# Patient Record
Sex: Male | Born: 1965 | Race: Black or African American | Hispanic: No | Marital: Single | Smoking: Never smoker
Health system: Southern US, Community
[De-identification: ages and names within clinical notes are randomized; demographics above are authoritative.]

## PROBLEM LIST (undated history)

## (undated) DIAGNOSIS — L940 Localized scleroderma [morphea]: Secondary | ICD-10-CM

## (undated) DIAGNOSIS — F32A Depression, unspecified: Secondary | ICD-10-CM

## (undated) DIAGNOSIS — F419 Anxiety disorder, unspecified: Secondary | ICD-10-CM

## (undated) DIAGNOSIS — I10 Essential (primary) hypertension: Secondary | ICD-10-CM

## (undated) DIAGNOSIS — M549 Dorsalgia, unspecified: Secondary | ICD-10-CM

## (undated) DIAGNOSIS — F329 Major depressive disorder, single episode, unspecified: Secondary | ICD-10-CM

## (undated) DIAGNOSIS — M1990 Unspecified osteoarthritis, unspecified site: Secondary | ICD-10-CM

## (undated) DIAGNOSIS — G8929 Other chronic pain: Secondary | ICD-10-CM

## (undated) HISTORY — PX: ROTATOR CUFF REPAIR: SHX139

## (undated) HISTORY — DX: Unspecified osteoarthritis, unspecified site: M19.90

## (undated) HISTORY — DX: Major depressive disorder, single episode, unspecified: F32.9

## (undated) HISTORY — DX: Anxiety disorder, unspecified: F41.9

## (undated) HISTORY — DX: Depression, unspecified: F32.A

---

## 2010-07-23 ENCOUNTER — Emergency Department (HOSPITAL_COMMUNITY): Admission: EM | Admit: 2010-07-23 | Discharge: 2010-07-23 | Payer: Self-pay | Admitting: Emergency Medicine

## 2010-12-24 LAB — URINE CULTURE
Colony Count: NO GROWTH
Culture  Setup Time: 201110131052
Culture: NO GROWTH

## 2010-12-24 LAB — URINALYSIS, ROUTINE W REFLEX MICROSCOPIC
Glucose, UA: NEGATIVE mg/dL
Hgb urine dipstick: NEGATIVE
Protein, ur: NEGATIVE mg/dL
pH: 5.5 (ref 5.0–8.0)

## 2010-12-24 LAB — GC/CHLAMYDIA PROBE AMP, GENITAL: Chlamydia, DNA Probe: NEGATIVE

## 2011-12-06 ENCOUNTER — Emergency Department (HOSPITAL_COMMUNITY)
Admission: EM | Admit: 2011-12-06 | Discharge: 2011-12-06 | Disposition: A | Discharge status: Home / Self Care | Payer: No Typology Code available for payment source | Attending: Emergency Medicine | Admitting: Emergency Medicine

## 2011-12-06 ENCOUNTER — Emergency Department (HOSPITAL_COMMUNITY): Payer: No Typology Code available for payment source

## 2011-12-06 ENCOUNTER — Encounter (HOSPITAL_COMMUNITY): Payer: Self-pay | Admitting: *Deleted

## 2011-12-06 DIAGNOSIS — S139XXA Sprain of joints and ligaments of unspecified parts of neck, initial encounter: Secondary | ICD-10-CM | POA: Insufficient documentation

## 2011-12-06 DIAGNOSIS — G8929 Other chronic pain: Secondary | ICD-10-CM | POA: Insufficient documentation

## 2011-12-06 DIAGNOSIS — S161XXA Strain of muscle, fascia and tendon at neck level, initial encounter: Secondary | ICD-10-CM

## 2011-12-06 DIAGNOSIS — I10 Essential (primary) hypertension: Secondary | ICD-10-CM

## 2011-12-06 DIAGNOSIS — M542 Cervicalgia: Secondary | ICD-10-CM | POA: Insufficient documentation

## 2011-12-06 DIAGNOSIS — R030 Elevated blood-pressure reading, without diagnosis of hypertension: Secondary | ICD-10-CM | POA: Insufficient documentation

## 2011-12-06 DIAGNOSIS — M25519 Pain in unspecified shoulder: Secondary | ICD-10-CM | POA: Insufficient documentation

## 2011-12-06 DIAGNOSIS — M549 Dorsalgia, unspecified: Secondary | ICD-10-CM | POA: Insufficient documentation

## 2011-12-06 DIAGNOSIS — T148XXA Other injury of unspecified body region, initial encounter: Secondary | ICD-10-CM | POA: Insufficient documentation

## 2011-12-06 HISTORY — DX: Dorsalgia, unspecified: M54.9

## 2011-12-06 HISTORY — DX: Other chronic pain: G89.29

## 2011-12-06 MED ORDER — ORPHENADRINE CITRATE ER 100 MG PO TB12: 100.0000 mg | ORAL_TABLET | Freq: Two times a day (BID) | ORAL | Status: AC

## 2011-12-06 MED ORDER — OXYCODONE-ACETAMINOPHEN 5-325 MG PO TABS: 1.0000 | ORAL_TABLET | ORAL | Status: AC | PRN
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
## 2011-12-06 NOTE — Discharge Instructions (Signed)
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 be a sign of 3 complications of pregnancy: preeclampsia, HELLP syndrome, and eclampsia. Follow up and control with medication is necessary.  SYMPTOMS   This condition normally does not produce any noticeable symptoms. It is usually found during a routine exam.   Malignant hypertension is a late problem of high blood pressure. It may have the following symptoms:   Headaches.   Blurred vision.   End-organ damage (this means your kidneys, heart, lungs, and other organs are being damaged).   Stressful situations can increase the blood pressure. If a person with normal blood pressure has their blood pressure go up while being seen by their caregiver, this is often termed "white coat hypertension." Its importance is not known. It may be related with eventually developing hypertension or complications of hypertension.   Hypertension is often confused with mental tension, stress, and anxiety.  DIAGNOSIS  The diagnosis is made  by 3 separate blood pressure measurements. They are taken at least 1 week apart from each other. If there is organ damage from hypertension, the diagnosis may be made without repeat measurements. Hypertension is  usually identified by having blood pressure readings:  Above 140/90 mmHg measured in both arms, at 3 separate times, over a couple weeks.   Over 130/80 mmHg should be considered a risk factor and may require treatment in patients with diabetes.  Blood pressure readings over 120/80 mmHg are called "pre-hypertension" even in non-diabetic patients. To get a true blood pressure measurement, use the following guidelines. Be aware of the factors that can alter blood pressure readings.  Take measurements at least 1 hour after caffeine.   Take measurements 30 minutes after smoking and without any stress. This is another reason to quit smoking - it raises your blood pressure.   Use a proper cuff size. Ask your caregiver if you are not sure about your cuff size.   Most home blood pressure cuffs are automatic. They will measure systolic and diastolic pressures. The systolic pressure is the pressure reading at the start of sounds. Diastolic pressure is the pressure at which the sounds disappear. If you are elderly, measure pressures in multiple postures. Try sitting, lying or standing.   Sit at rest for a minimum of 5 minutes before taking measurements.   You should not be on any medications like decongestants. These are found in many cold medications.   Record your blood pressure readings and review them with your caregiver.  If you have hypertension:  Your caregiver may do tests to be sure you do not have secondary hypertension (see "causes" above).   Your caregiver may also look for signs of metabolic syndrome. This is also called Syndrome X or Insulin Resistance Syndrome. You may have this syndrome if you have type 2 diabetes, abdominal obesity, and abnormal blood lipids in addition to  hypertension.   Your caregiver will take your medical and family history and perform a physical exam.   Diagnostic tests may include blood tests (for glucose, cholesterol, potassium, and kidney function), a urinalysis, or an EKG. Other tests may also be necessary depending on your condition.  PREVENTION  There are important lifestyle issues that you can adopt to reduce your chance of developing hypertension:  Maintain a normal weight.   Limit the amount of salt (sodium) in your diet.   Exercise often.   Limit alcohol intake.   Get enough potassium in your diet. Discuss specific advice with your caregiver.   Follow a DASH diet (dietary approaches to stop hypertension). This diet is rich in fruits, vegetables, and low-fat dairy products, and avoids certain fats.  PROGNOSIS  Essential hypertension cannot be cured. Lifestyle changes and medical treatment can lower blood pressure and reduce complications. The prognosis of secondary hypertension depends on the underlying cause. Many people whose hypertension is controlled with medicine or lifestyle changes can live a normal, healthy life.  RISKS AND COMPLICATIONS  While high blood pressure alone is not an illness, it often requires treatment due to its short- and long-term effects on many organs. Hypertension increases your risk for:  CVAs or strokes (cerebrovascular accident).   Heart failure due to chronically high blood pressure (hypertensive cardiomyopathy).   Heart attack (myocardial infarction).   Damage to the retina (hypertensive retinopathy).   Kidney failure (hypertensive nephropathy).  Your caregiver can explain list items above that apply to you. Treatment of hypertension can significantly reduce the risk of complications. TREATMENT   For overweight patients, weight loss and regular exercise are recommended. Physical fitness lowers blood pressure.   Mild hypertension is usually treated with diet and exercise. A diet rich  in fruits  and vegetables, fat-free dairy products, and foods low in fat and salt (sodium) can help lower blood pressure. Decreasing salt intake decreases blood pressure in a 1/3 of people.   Stop smoking if you are a smoker.  The steps above are highly effective in reducing blood pressure. While these actions are easy to suggest, they are difficult to achieve. Most patients with moderate or severe hypertension end up requiring medications to bring their blood pressure down to a normal level. There are several classes of medications for treatment. Blood pressure pills (antihypertensives) will lower blood pressure by their different actions. Lowering the blood pressure by 10 mmHg may decrease the risk of complications by as much as 25%. The goal of treatment is effective blood pressure control. This will reduce your risk for complications. Your caregiver will help you determine the best treatment for you according to your lifestyle. What is excellent treatment for one person, may not be for you. HOME CARE INSTRUCTIONS   Do not smoke.   Follow the lifestyle changes outlined in the "Prevention" section.   If you are on medications, follow the directions carefully. Blood pressure medications must be taken as prescribed. Skipping doses reduces their benefit. It also puts you at risk for problems.   Follow up with your caregiver, as directed.   If you are asked to monitor your blood pressure at home, follow the guidelines in the "Diagnosis" section above.  SEEK MEDICAL CARE IF:   You think you are having medication side effects.   You have recurrent headaches or lightheadedness.   You have swelling in your ankles.   You have trouble with your vision.  SEEK IMMEDIATE MEDICAL CARE IF:   You have sudden onset of chest pain or pressure, difficulty breathing, or other symptoms of a heart attack.   You have a severe headache.   You have symptoms of a stroke (such as sudden weakness, difficulty  speaking, difficulty walking).  MAKE SURE YOU:   Understand these instructions.   Will watch your condition.   Will get help right away if you are not doing well or get worse.  Document Released: 09/27/2005 Document Revised: 06/09/2011 Document Reviewed: 04/27/2007 [facility] Patient Information 2012 [place], [place].
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
## 2011-12-06 NOTE — ED Notes (Signed)
Patient transported to X-ray 

## 2012-03-23 ENCOUNTER — Encounter (HOSPITAL_COMMUNITY): Payer: Self-pay | Admitting: Emergency Medicine

## 2012-03-23 ENCOUNTER — Emergency Department (HOSPITAL_COMMUNITY)
Admission: EM | Admit: 2012-03-23 | Discharge: 2012-03-23 | Disposition: A | Discharge status: Home / Self Care | Payer: Self-pay | Attending: Emergency Medicine | Admitting: Emergency Medicine

## 2012-03-23 DIAGNOSIS — M545 Low back pain, unspecified: Secondary | ICD-10-CM | POA: Insufficient documentation

## 2012-03-23 DIAGNOSIS — M549 Dorsalgia, unspecified: Secondary | ICD-10-CM

## 2012-03-23 DIAGNOSIS — I10 Essential (primary) hypertension: Secondary | ICD-10-CM | POA: Insufficient documentation

## 2012-03-23 DIAGNOSIS — G8929 Other chronic pain: Secondary | ICD-10-CM | POA: Insufficient documentation

## 2012-03-23 HISTORY — DX: Localized scleroderma (morphea): L94.0

## 2012-03-23 HISTORY — DX: Essential (primary) hypertension: I10

## 2012-03-23 HISTORY — DX: Dorsalgia, unspecified: M54.9

## 2012-03-23 MED ORDER — HYDROCODONE-ACETAMINOPHEN 5-325 MG PO TABS: 2.0000 | ORAL_TABLET | ORAL | Status: AC | PRN

## 2012-03-23 MED ORDER — DIAZEPAM 5 MG PO TABS: 5.0000 mg | ORAL_TABLET | Freq: Two times a day (BID) | ORAL | Status: AC

## 2012-03-23 MED ORDER — IBUPROFEN 600 MG PO TABS: 600.0000 mg | ORAL_TABLET | Freq: Four times a day (QID) | ORAL | Status: AC | PRN

## 2012-03-23 MED ORDER — LISINOPRIL-HYDROCHLOROTHIAZIDE 10-12.5 MG PO TABS: 1.0000 | ORAL_TABLET | Freq: Every day | ORAL | Status: AC

## 2012-03-23 NOTE — ED Provider Notes (Signed)
History     CSN: [id number]  Arrival date & time 03/23/12  1347   First MD Initiated Contact with Patient 03/23/12 1514      Chief Complaint  Patient presents with  . Back Pain     low back pain x 1 week .denies trauma. Using TENS unit    (Consider location/radiation/quality/duration/timing/severity/associated sxs/prior treatment) HPI Comments: Patient reports that he has been having upper back pain for the past 4 days.  Pain worse with movement.  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  He feels that this has contributed to the pain.  No acute trauma.  He reports that the pain feels like the pain he has had in the past with a pulled muscle.  Patient is a 46 y.o. male presenting with back pain. The history is provided by the patient.  Back Pain  This is a new problem. Episode onset: 4 days ago. The problem occurs constantly. The problem has been gradually worsening. The pain is associated with lifting heavy objects. The pain is present in the thoracic spine. The quality of the pain is described as aching. The pain does not radiate. The pain is moderate. The symptoms are aggravated by twisting and bending (lifting heavy objects). Pertinent negatives include no chest pain, no fever, no numbness, no abdominal pain, no bowel incontinence, no perianal numbness, no bladder incontinence, no paresthesias, no paresis, no tingling and no weakness. He has tried nothing for the symptoms.    Past Medical History  Diagnosis Date  . Chronic back pain   . Back pain   . Morphea   . Hypertension     Past Surgical History  Procedure Date  . Rotator cuff repair     Right    Family History  Problem Relation Age of Onset  . Diabetes Mother   . Hypertension Mother     History  Substance Use Topics  . Smoking status: Never Smoker   . Smokeless tobacco: Former Neurosurgeon  . Alcohol Use: Yes     occasionally      Review of  Systems  Constitutional: Negative for fever and chills.  HENT: Negative for neck pain and neck stiffness.   Respiratory: Negative.   Cardiovascular: Negative for chest pain.  Gastrointestinal: Negative for nausea, vomiting, abdominal pain and bowel incontinence.  Genitourinary: Negative for bladder incontinence and decreased urine volume.       No urinary retention No bowel or bladder incontinence  Musculoskeletal: Positive for back pain. Negative for gait problem.  Skin: Negative for rash.  Neurological: Negative for tingling, weakness, numbness and paresthesias.    Allergies  Review of patient's allergies indicates no known allergies.  Home Medications  No current outpatient prescriptions on file.  BP 156/108  Pulse 71  Temp 98.3 F (36.8 C) (Oral)  Resp 16  SpO2 100%  Physical Exam  Nursing note and vitals reviewed. Constitutional: He is oriented to person, place, and time. He appears well-developed and well-nourished. No distress.  HENT:  Head: Normocephalic and atraumatic.  Mouth/Throat: Oropharynx is clear and moist.  Eyes: Conjunctivae and EOM are normal. Pupils are equal, round, and reactive to light. No scleral icterus.  Neck: Normal range of motion and full passive range of motion without pain. Neck supple. No spinous process tenderness and no muscular tenderness present. No rigidity. Normal range of motion present.  Cardiovascular: Normal rate, regular rhythm, normal heart sounds and intact  distal pulses.   Pulmonary/Chest: Effort normal and breath sounds normal. No respiratory distress. He has no wheezes. He has no rales.  Musculoskeletal: Normal range of motion.       Cervical back: He exhibits normal range of motion, no tenderness, no bony tenderness, no edema, no deformity and no pain.       Thoracic back: He exhibits normal range of motion, no tenderness, no bony tenderness, no swelling, no deformity and no pain.       Lumbar back: He exhibits normal range of  motion, no tenderness, no bony tenderness, no swelling, no deformity, no spasm and normal pulse.       Bilateral lower extremities nontender without color change, baseline range of motion of extremities with intact distal pulses, capillary refill less than 2 seconds bilaterally.  Pt has increased pain w ROM of lumbar spine. Pain w ambulation, no sign of ataxia.  Neurological: He is alert and oriented to person, place, and time. He has normal strength and normal reflexes. No sensory deficit. Gait normal.       Sensation at baseline for light touch in all 4 distal extremities, motor symmetric & bilateral 5/5 (hips: abduction, adduction, flexion; knee: flexion & extension; foot: dorsiflexion, plantar flexion, toes: dorsi flexion) Patellar & ankle reflexes intact.   Skin: Skin is warm and dry. No rash noted. He is not diaphoretic. No erythema. No pallor.  Psychiatric: He has a normal mood and affect.    ED Course  Procedures (including critical care time)  Labs Reviewed - No data to display No results found.   No diagnosis found.    MDM  Patient with back pain.  No neurological deficits and normal neuro exam.  Patient can walk but states is painful.  No loss of bowel or bladder control.  No concern for cauda equina.  No fever, night sweats, weight loss, h/o cancer, IVDU.  RICE protocol and pain medicine indicated and discussed with patient.  Patient has been lifting heavy objects at work.  Suspect pain is muscular.  Patient given Rx for muscle relaxer.          Pascal Lux [place], PA-C 03/25/12 1725

## 2012-03-23 NOTE — Discharge Instructions (Signed)
Followup with orthopedics if symptoms continue. Use conservative methods at home including heat therapy and cold therapy as we discussed. More information on cold therapy is listed below.  It is not reccommended to use heat treatment directly after an acute injury. ° °SEEK IMMEDIATE MEDICAL ATTENTION IF: °New numbness, tingling, weakness, or problem with the use of your arms or legs.  °Severe back pain not relieved with medications.  °Change in bowel or bladder control.  °Increasing pain in any areas of the body (such as chest or abdominal pain).  °Shortness of breath, dizziness or fainting.  °Nausea (feeling sick to your stomach), vomiting, fever, or sweats. ° °COLD THERAPY DIRECTIONS:  °Ice or gel packs can be used to reduce both pain and swelling. Ice is the most helpful within the first 24 to 48 hours after an injury or flareup from overusing a muscle or joint.  Ice is effective, has very few side effects, and is safe for most people to use.  ° °If you expose your skin to cold temperatures for too long or without the proper protection, you can damage your skin or nerves. Watch for signs of skin damage due to cold.  ° °HOME CARE INSTRUCTIONS  °Follow these tips to use ice and cold packs safely.  °Place a dry or damp towel between the ice and skin. A damp towel will cool the skin more quickly, so you may need to shorten the time that the ice is used.  °For a more rapid response, add gentle compression to the ice.  °Ice for no more than 10 to 20 minutes at a time. The bonier the area you are icing, the less time it will take to get the benefits of ice.  °Check your skin after 5 minutes to make sure there are no signs of a poor response to cold or skin damage.  °Rest 20 minutes or more in between uses.  °Once your skin is numb, you can end your treatment. You can test numbness by very lightly touching your skin. The touch should be so light that you do not see the skin dimple from the pressure of your fingertip.  When using ice, most people will feel these normal sensations in this order: cold, burning, aching, and numbness.  °Do not use ice on someone who cannot communicate their responses to pain, such as small children or people with dementia.  ° °HOW TO MAKE AN ICE PACK  °To make an ice pack, do one of the following:  °Place crushed ice or a bag of frozen vegetables in a sealable plastic bag. Squeeze out the excess air. Place this bag inside another plastic bag. Slide the bag into a pillowcase or place a damp towel between your skin and the bag.  °Mix 3 parts water with 1 part rubbing alcohol. Freeze the mixture in a sealable plastic bag. When you remove the mixture from the freezer, it will be slushy. Squeeze out the excess air. Place this bag inside another plastic bag. Slide the bag into a pillowcase or place a damp towel between your skin and the bag.  ° °SEEK MEDICAL CARE IF:  °You develop white spots on your skin. This may give the skin a blotchy (mottled) appearance.  °Your skin turns blue or pale.  °Your skin becomes waxy or hard.  °Your swelling gets worse.  °MAKE SURE YOU:  °Understand these instructions.  °Will watch your condition.  °Will get help right away if you are not doing well or   get worse.  ° ° ° ° ° ° °

## 2012-03-23 NOTE — ED Notes (Signed)
Pt reports hx of back pain. This upper back  pain is new

## 2012-04-03 NOTE — ED Provider Notes (Signed)
Medical screening examination/treatment/procedure(s) were performed by non-physician practitioner and as supervising physician I was immediately available for consultation/collaboration.  Shaylan Tutton, MD 04/03/12 0724 

## 2017-01-31 ENCOUNTER — Encounter (HOSPITAL_COMMUNITY): Payer: Self-pay | Admitting: Emergency Medicine

## 2017-01-31 ENCOUNTER — Emergency Department (HOSPITAL_COMMUNITY)
Admission: EM | Admit: 2017-01-31 | Discharge: 2017-01-31 | Disposition: A | Discharge status: Home / Self Care | Payer: Managed Care, Other (non HMO) | Attending: Emergency Medicine | Admitting: Emergency Medicine

## 2017-01-31 DIAGNOSIS — E860 Dehydration: Secondary | ICD-10-CM

## 2017-01-31 DIAGNOSIS — Z79899 Other long term (current) drug therapy: Secondary | ICD-10-CM | POA: Insufficient documentation

## 2017-01-31 DIAGNOSIS — R110 Nausea: Secondary | ICD-10-CM

## 2017-01-31 DIAGNOSIS — I10 Essential (primary) hypertension: Secondary | ICD-10-CM | POA: Insufficient documentation

## 2017-01-31 DIAGNOSIS — N289 Disorder of kidney and ureter, unspecified: Secondary | ICD-10-CM

## 2017-01-31 LAB — COMPREHENSIVE METABOLIC PANEL
ALK PHOS: 45 U/L (ref 38–126)
ALT: 14 U/L — ABNORMAL LOW (ref 17–63)
ANION GAP: 8 (ref 5–15)
AST: 26 U/L (ref 15–41)
Albumin: 4.5 g/dL (ref 3.5–5.0)
BILIRUBIN TOTAL: 0.8 mg/dL (ref 0.3–1.2)
BUN: 15 mg/dL (ref 6–20)
CALCIUM: 9.7 mg/dL (ref 8.9–10.3)
CO2: 26 mmol/L (ref 22–32)
Chloride: 101 mmol/L (ref 101–111)
Creatinine, Ser: 1.38 mg/dL — ABNORMAL HIGH (ref 0.61–1.24)
GFR calc Af Amer: >60 mL/min (ref >60)
GFR, EST NON AFRICAN AMERICAN: 58 mL/min — AB (ref >60)
Glucose, Bld: 105 mg/dL — ABNORMAL HIGH (ref 65–99)
POTASSIUM: 4.1 mmol/L (ref 3.5–5.1)
Sodium: 135 mmol/L (ref 135–145)
TOTAL PROTEIN: 7.9 g/dL (ref 6.5–8.1)

## 2017-01-31 LAB — CBC WITH DIFFERENTIAL/PLATELET
BASOS ABS: 0 10*3/uL (ref 0.0–0.1)
BASOS PCT: 0 %
EOS ABS: 0.1 10*3/uL (ref 0.0–0.7)
Eosinophils Relative: 2 %
HCT: 44.5 % (ref 39.0–52.0)
HEMOGLOBIN: 16.2 g/dL (ref 13.0–17.0)
LYMPHS PCT: 32 %
Lymphs Abs: 1.9 10*3/uL (ref 0.7–4.0)
MCH: 27 pg (ref 26.0–34.0)
MCHC: 36.4 g/dL — AB (ref 30.0–36.0)
MCV: 74.2 fL — ABNORMAL LOW (ref 78.0–100.0)
MONO ABS: 0.5 10*3/uL (ref 0.1–1.0)
Monocytes Relative: 9 %
NEUTROS ABS: 3.3 10*3/uL (ref 1.7–7.7)
Neutrophils Relative %: 57 %
PLATELETS: 228 10*3/uL (ref 150–400)
RBC: 6 MIL/uL — ABNORMAL HIGH (ref 4.22–5.81)
RDW: 14.7 % (ref 11.5–15.5)
WBC: 5.8 10*3/uL (ref 4.0–10.5)

## 2017-01-31 LAB — URINALYSIS, ROUTINE W REFLEX MICROSCOPIC
BILIRUBIN URINE: NEGATIVE
Glucose, UA: NEGATIVE mg/dL
HGB URINE DIPSTICK: NEGATIVE
KETONES UR: NEGATIVE mg/dL
Leukocytes, UA: NEGATIVE
NITRITE: NEGATIVE
PROTEIN: NEGATIVE mg/dL
SPECIFIC GRAVITY, URINE: 1.025 (ref 1.005–1.030)
pH: 5 (ref 5.0–8.0)

## 2017-01-31 LAB — RAPID URINE DRUG SCREEN, HOSP PERFORMED
Amphetamines: NOT DETECTED
Barbiturates: NOT DETECTED
Benzodiazepines: NOT DETECTED
COCAINE: NOT DETECTED
OPIATES: NOT DETECTED
TETRAHYDROCANNABINOL: POSITIVE — AB

## 2017-01-31 LAB — TROPONIN I: TROPONIN I: 0.03 ng/mL — AB (ref <0.03)

## 2017-01-31 MED ORDER — SODIUM CHLORIDE 0.9 % IV BOLUS (SEPSIS)
1000.0000 mL | Freq: Once | INTRAVENOUS | Status: AC
  Administered 2017-01-31: 1000 mL via INTRAVENOUS

## 2017-01-31 MED ORDER — ONDANSETRON HCL 4 MG/2ML IJ SOLN
4.0000 mg | Freq: Once | INTRAMUSCULAR | Status: AC
  Administered 2017-01-31: 4 mg via INTRAVENOUS
  Filled 2017-01-31: qty 2

## 2017-01-31 MED ORDER — ONDANSETRON 4 MG PO TBDP: 4.0000 mg | ORAL_TABLET | Freq: Three times a day (TID) | ORAL | 0 refills | Status: AC | PRN
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
## 2017-01-31 NOTE — ED Notes (Signed)
Date and time results received: 01/31/17 14:52  Test: troponin Critical Value: 0.03  Name of Provider Notified: [place]  Orders Received? Or Actions Taken?: Waiting for new orders

## 2017-02-09 ENCOUNTER — Ambulatory Visit (INDEPENDENT_AMBULATORY_CARE_PROVIDER_SITE_OTHER): Payer: Managed Care, Other (non HMO)

## 2017-02-09 ENCOUNTER — Ambulatory Visit (INDEPENDENT_AMBULATORY_CARE_PROVIDER_SITE_OTHER): Payer: Managed Care, Other (non HMO) | Admitting: Emergency Medicine

## 2017-02-09 ENCOUNTER — Encounter: Payer: Self-pay | Admitting: Emergency Medicine

## 2017-02-09 VITALS — BP 123/92 | HR 69 | Temp 98.8°F | Resp 18 | Ht 71.0 in | Wt 243.0 lb

## 2017-02-09 DIAGNOSIS — M25512 Pain in left shoulder: Secondary | ICD-10-CM

## 2017-02-09 DIAGNOSIS — M545 Low back pain, unspecified: Secondary | ICD-10-CM

## 2017-02-09 DIAGNOSIS — M47816 Spondylosis without myelopathy or radiculopathy, lumbar region: Secondary | ICD-10-CM

## 2017-02-09 MED ORDER — DICLOFENAC SODIUM 75 MG PO TBEC: 75.0000 mg | DELAYED_RELEASE_TABLET | Freq: Two times a day (BID) | ORAL | 0 refills | Status: AC

## 2017-02-09 NOTE — Progress Notes (Signed)
Ian Hoffman 51 y.o.   Chief Complaint  Patient presents with  . Shoulder Pain    left shoulder feels like pt pulled something   . Back Pain    would like lower back evaluated     HISTORY OF PRESENT ILLNESS: This is a 51 y.o. male complaining of pain to left shoulder and low back; left shoulder x 2-3 weeks; low back for years.  HPI   Prior to Admission medications   Medication Sig Start Date End Date Taking? Authorizing Provider  lisinopril-hydrochlorothiazide (PRINZIDE) 10-12.5 MG per tablet Take 1 tablet by mouth daily. 03/23/12 03/23/13  Ian Laisure, PA-C  ondansetron (ZOFRAN ODT) 4 MG disintegrating tablet Take 1 tablet (4 mg total) by mouth every 8 (eight) hours as needed for nausea or vomiting. Patient not taking: Reported on 02/09/2017 01/31/17   Ian Lefevre, MD    No Known Allergies  There are no active problems to display for this patient.   Past Medical History:  Diagnosis Date  . Anxiety   . Arthritis   . Back pain   . Chronic back pain   . Depression   . Hypertension   . Morphea     Past Surgical History:  Procedure Laterality Date  . ROTATOR CUFF REPAIR     Right    Social History   Social History  . Marital status: Single    Spouse name: N/A  . Number of children: N/A  . Years of education: N/A   Occupational History  . Not on file.   Social History Main Topics  . Smoking status: Never Smoker  . Smokeless tobacco: Former Neurosurgeon  . Alcohol use Yes     Comment: occasionally  . Drug use: No  . Sexual activity: Yes    Birth control/ protection: None   Other Topics Concern  . Not on file   Social History Narrative  . No narrative on file    Family History  Problem Relation Age of Onset  . Diabetes Mother   . Hypertension Mother      Review of Systems  Constitutional: Negative.  Negative for chills and fever.  HENT: Negative.   Eyes: Negative.   Respiratory: Negative for cough and shortness of breath.   Cardiovascular:  Negative.  Negative for chest pain, palpitations and leg swelling.  Gastrointestinal: Negative for abdominal pain, diarrhea, nausea and vomiting.  Genitourinary: Negative for dysuria and hematuria.  Musculoskeletal: Positive for back pain and joint pain (left shoulder).  Skin: Negative.  Negative for rash.  Neurological: Negative for dizziness, sensory change, focal weakness and headaches.  Endo/Heme/Allergies: Negative.   All other systems reviewed and are negative.  Vitals:   02/09/17 1136  BP: (!) 123/92  Pulse: 69  Resp: 18  Temp: 98.8 F (37.1 C)     Physical Exam  Constitutional: He is oriented to person, place, and time. He appears well-developed and well-nourished.  HENT:  Head: Normocephalic and atraumatic.  Mouth/Throat: Oropharynx is clear and moist.  Eyes: EOM are normal. Pupils are equal, round, and reactive to light.  Neck: Normal range of motion. Neck supple. No JVD present.  Cardiovascular: Normal rate, regular rhythm and normal heart sounds.   Pulmonary/Chest: Effort normal and breath sounds normal.  Abdominal: Soft. Bowel sounds are normal. He exhibits no distension. There is no tenderness.  Musculoskeletal:       Lumbar back: Normal. He exhibits normal range of motion, no tenderness, no bony tenderness, no pain and no spasm.  Left shoulder: FROM, non-tender  Lymphadenopathy:    He has no cervical adenopathy.  Neurological: He is alert and oriented to person, place, and time. No sensory deficit. He exhibits normal muscle tone.  Skin: Skin is warm and dry. Capillary refill takes less than 2 seconds.  Psychiatric: He has a normal mood and affect. His behavior is normal.  Vitals reviewed.    ASSESSMENT & PLAN: Ian Hoffman was seen today for shoulder pain and back pain.  Diagnoses and all orders for this visit:  Acute pain of left shoulder Comments: r/o rotator cuff injury Orders: -     DG Shoulder Left; Future -     Ambulatory referral to Orthopedic  Surgery  Lumbar pain Comments: acute on chronic Orders: -     DG Lumbar Spine 2-3 Views; Future -     Ambulatory referral to Orthopedic Surgery  Spondylosis of lumbar region without myelopathy or radiculopathy  Other orders -     diclofenac (VOLTAREN) 75 MG EC tablet; Take 1 tablet (75 mg total) by mouth 2 (two) times daily.  X-rays: no bony abnormalities.  Patient Instructions       IF you received an x-ray today, you will receive an invoice from [facility]. Please contact [facility] at [phone] with questions or concerns regarding your invoice.   IF you received labwork today, you will receive an invoice from [facility]. Please contact [facility] at [phone] with questions or concerns regarding your invoice.   Our billing staff will not be able to assist you with questions regarding bills from these companies.  You will be contacted with the lab results as soon as they are available. The fastest way to get your results is to activate your My Chart account. Instructions are located on the last page of this paperwork. If you have not heard from [place] regarding the results in 2 weeks, please contact this office.      Shoulder Pain Many things can cause shoulder pain, including:  An injury.  Moving the arm in the same way again and again (overuse).  Joint pain (arthritis). Follow these instructions at home: Take these actions to help with your pain:  Squeeze a soft ball or a foam pad as much as you can. This helps to prevent swelling. It also makes the arm stronger.  Take over-the-counter and prescription medicines only as told by your doctor.  If told, put ice on the area:  Put ice in a plastic bag.  Place a towel between your skin and the bag.  Leave the ice on for 20 minutes, 2-3 times per day. Stop putting on ice if it does not help with the pain.  If you were given a shoulder sling or immobilizer:  Wear it as told.  Remove it to  shower or bathe.  Move your arm as little as possible.  Keep your hand moving. This helps prevent swelling. Contact a doctor if:  Your pain gets worse.  Medicine does not help your pain.  You have new pain in your arm, hand, or fingers. Get help right away if:  Your arm, hand, or fingers:  Tingle.  Are numb.  Are swollen.  Are painful.  Turn white or blue. This information is not intended to replace advice given to you by your health care provider. Make sure you discuss any questions you have with your health care provider. Document Released: 03/15/2008 Document Revised: 05/23/2016 Document Reviewed: 01/20/2015 Elsevier Interactive Patient Education  2017 [employer].  Agustina Caroli, MD [facility]

## 2017-02-09 NOTE — Patient Instructions (Addendum)
     IF you received an x-ray today, you will receive an invoice from [facility]. Please contact [facility] at [phone] with questions or concerns regarding your invoice.   IF you received labwork today, you will receive an invoice from [facility]. Please contact [facility] at [phone] with questions or concerns regarding your invoice.   Our billing staff will not be able to assist you with questions regarding bills from these companies.  You will be contacted with the lab results as soon as they are available. The fastest way to get your results is to activate your My Chart account. Instructions are located on the last page of this paperwork. If you have not heard from us regarding the results in 2 weeks, please contact this office.      Shoulder Pain Many things can cause shoulder pain, including:  An injury.  Moving the arm in the same way again and again (overuse).  Joint pain (arthritis). Follow these instructions at home: Take these actions to help with your pain:  Squeeze a soft ball or a foam pad as much as you can. This helps to prevent swelling. It also makes the arm stronger.  Take over-the-counter and prescription medicines only as told by your doctor.  If told, put ice on the area:  Put ice in a plastic bag.  Place a towel between your skin and the bag.  Leave the ice on for 20 minutes, 2-3 times per day. Stop putting on ice if it does not help with the pain.  If you were given a shoulder sling or immobilizer:  Wear it as told.  Remove it to shower or bathe.  Move your arm as little as possible.  Keep your hand moving. This helps prevent swelling. Contact a doctor if:  Your pain gets worse.  Medicine does not help your pain.  You have new pain in your arm, hand, or fingers. Get help right away if:  Your arm, hand, or fingers:  Tingle.  Are numb.  Are swollen.  Are painful.  Turn white or blue. This information is  not intended to replace advice given to you by your health care provider. Make sure you discuss any questions you have with your health care provider. Document Released: 03/15/2008 Document Revised: 05/23/2016 Document Reviewed: 01/20/2015 Elsevier Interactive Patient Education  2017 [employer] Inc[employer]  

## 2017-03-02 ENCOUNTER — Encounter (INDEPENDENT_AMBULATORY_CARE_PROVIDER_SITE_OTHER): Payer: Self-pay

## 2017-03-02 ENCOUNTER — Encounter (INDEPENDENT_AMBULATORY_CARE_PROVIDER_SITE_OTHER): Payer: Self-pay | Admitting: Orthopedic Surgery

## 2017-03-02 ENCOUNTER — Ambulatory Visit (INDEPENDENT_AMBULATORY_CARE_PROVIDER_SITE_OTHER): Payer: Managed Care, Other (non HMO) | Admitting: Orthopedic Surgery

## 2017-03-02 DIAGNOSIS — M5441 Lumbago with sciatica, right side: Secondary | ICD-10-CM | POA: Diagnosis not present

## 2017-03-02 NOTE — Progress Notes (Signed)
Office Visit Note   Patient: Ian Hoffman           Date of Birth: [birth year]           MRN: [id number] Visit Date: 03/02/2017 Requested by: Georgina Quint, MD [address] [place], [place] [address] PCP: Patient, No Pcp Per  Subjective: Chief Complaint  Patient presents with  . Left Shoulder - Pain  . Lower Back - Pain    HPI: Ian Hoffman is a 51 year old patient with chronic low back pain and left shoulder pain.  He is in [employer] before.  He was medically discharged 1989 with 10% disability rating and the back.  As he is getting older his back is getting worse.  He describes pain radiating down both the legs with difficulty sleeping.  He describes shooting pains.  He went to the chiropractor and radiographs were done which were reported as normal.  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  He is taking diclofenac which helps.  Denies any discrete injury.  He does describe sleep interference as well as tripping over the right leg.  Denies any saddle paresthesias.              ROS: All systems reviewed are negative as they relate to the chief complaint within the history of present illness.  Patient denies  fevers or chills.   Assessment & Plan: Visit Diagnoses:  1. Midline low back pain with right-sided sciatica, unspecified chronicity     Plan: Impression is back pain with some ankle dorsiflexion weakness and right radicular symptoms.  This is been going on for many months.  He needs MRI of his lumbar spine because of this dorsiflexion weakness.  I'll see him back after that study.  In regards to the left shoulder he has full active and passive range of motion of the shoulder so I don't think it's rotator cuff pathology based on strength and absence of course grinding and crepitus with internal and external rotation.  The something we can watch.  I'll see him back after his MRI scan of the back.  Follow-Up Instructions:  No Follow-up on file.   Orders:  Orders Placed This Encounter  Procedures  . MR Lumbar Spine w/o contrast   No orders of the defined types were placed in this encounter.     Procedures: No procedures performed   Clinical Data: No additional findings.  Objective: Vital Signs: There were no vitals taken for this visit.  Physical Exam:   Constitutional: Patient appears well-developed HEENT:  Head: Normocephalic Eyes:EOM are normal Neck: Normal range of motion Cardiovascular: Normal rate Pulmonary/chest: Effort normal Neurologic: Patient is alert Skin: Skin is warm Psychiatric: Patient has normal mood and affect    Ortho Exam: Orthopedic exam demonstrates some ankle dorsiflexion weakness on the right compared to the left.  Some paresthesias in L5 distribution right versus left.  Negative Babinski negative clonus does have a little bit of nerve root tension sign positive on the right compared to left.  No groin pain with internal rotation leg.  Does have pain with forward bending but more pain with extension.  No trochanteric tenderness is noted.  No other masses lymph and after skin changes noted in the back region.  Specialty Comments:  No specialty comments available.  Imaging: No results found.   PMFS History: Patient Active Problem List   Diagnosis Date Noted  . Acute pain of left  shoulder 02/09/2017  . Lumbar pain 02/09/2017  . Spondylosis of lumbar region without myelopathy or radiculopathy 02/09/2017   Past Medical History:  Diagnosis Date  . Anxiety   . Arthritis   . Back pain   . Chronic back pain   . Depression   . Hypertension   . Morphea     Family History  Problem Relation Age of Onset  . Diabetes Mother   . Hypertension Mother     Past Surgical History:  Procedure Laterality Date  . ROTATOR CUFF REPAIR     Right   Social History   Occupational History  . Not on file.   Social History Main Topics  . Smoking status: Never Smoker    . Smokeless tobacco: Former NeurosurgeonUser  . Alcohol use Yes     Comment: occasionally  . Drug use: No  . Sexual activity: Yes    Birth control/ protection: None

## 2017-03-16 ENCOUNTER — Ambulatory Visit (INDEPENDENT_AMBULATORY_CARE_PROVIDER_SITE_OTHER): Payer: Managed Care, Other (non HMO) | Admitting: Orthopedic Surgery

## 2017-03-23 ENCOUNTER — Ambulatory Visit
Admission: RE | Admit: 2017-03-23 | Discharge: 2017-03-23 | Disposition: A | Discharge status: Home / Self Care | Payer: Managed Care, Other (non HMO) | Source: Ambulatory Visit | Attending: Orthopedic Surgery | Admitting: Orthopedic Surgery

## 2017-03-23 DIAGNOSIS — M5441 Lumbago with sciatica, right side: Secondary | ICD-10-CM

## 2017-03-30 ENCOUNTER — Encounter (INDEPENDENT_AMBULATORY_CARE_PROVIDER_SITE_OTHER): Payer: Self-pay | Admitting: Orthopedic Surgery

## 2017-03-30 ENCOUNTER — Ambulatory Visit (INDEPENDENT_AMBULATORY_CARE_PROVIDER_SITE_OTHER): Payer: Managed Care, Other (non HMO) | Admitting: Orthopedic Surgery

## 2017-03-30 DIAGNOSIS — M5440 Lumbago with sciatica, unspecified side: Secondary | ICD-10-CM

## 2017-03-30 NOTE — Progress Notes (Signed)
Office Visit Note   Patient: Ian Hoffman           Date of Birth: 25-Sep-1966           MRN: [id number] Visit Date: 03/30/2017 Requested by: No referring provider defined for this encounter. PCP: Patient, No Pcp Per  Subjective: Chief Complaint  Patient presents with  . Lower Back - Follow-up    HPI: Ian Hoffman is a 51 year old patient with low back pain.  Since I seems had an MRI scan of his lumbar spine.  In general he's used to the pain.  Does report occasional leg pain but has pain which will keep him up at night.  Most of his pain is in his back.  It does hurt him all the time.  He likes to walk for exercise.  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  He does continue to work.              ROS: All systems reviewed are negative as they relate to the chief complaint within the history of present illness.  Patient denies  fevers or chills.   Assessment & Plan: Visit Diagnoses:  1. Midline low back pain with sciatica, sciatica laterality unspecified, unspecified chronicity     Plan: Impression is grade 2 L5-S1 spondylolisthesis which is likely progressed from when was diagnosed in the late 1980s.  Currently he is functional but has severe bilateral foraminal stenosis at L5-S1 which I think could become symptomatic at any time with very little precipitating events.  Natural history of this is for progression.  I would recommend that Ian Hoffman discuss this case with a orthopedic surgeon or neurosurgeon for consideration of 1 level fusion and decompression.  I think that waiting for this to become more symptomatic or more deformed would only increase the difficulty of the surgery later as well as the possibility of having a good outcome.  I will see him back as needed.  Follow-Up Instructions: No Follow-up on file.   Orders:  No orders of the defined types were placed in this  encounter.  No orders of the defined types were placed in this encounter.     Procedures: No procedures performed   Clinical Data: No additional findings.  Objective: Vital Signs: There were no vitals taken for this visit.  Physical Exam:   Constitutional: Patient appears well-developed HEENT:  Head: Normocephalic Eyes:EOM are normal Neck: Normal range of motion Cardiovascular: Normal rate Pulmonary/chest: Effort normal Neurologic: Patient is alert Skin: Skin is warm Psychiatric: Patient has normal mood and affect    Ortho Exam: Orthopedic exam demonstrates good ankle dorsi and plantar flexion strength with mild nerve root tension signs no groin pain and no leg atrophy pretty normal gait and alignment and perfused feet.  The rest of his exam is unchanged.  Specialty Comments:  No specialty comments available.  Imaging: No results found.   PMFS History: Patient Active Problem List   Diagnosis Date Noted  . Acute pain of left shoulder 02/09/2017  . Lumbar pain 02/09/2017  . Spondylosis of lumbar region without myelopathy or radiculopathy 02/09/2017   Past Medical History:  Diagnosis Date  . Anxiety   . Arthritis   . Back pain   . Chronic back pain   . Depression   . Hypertension   . Morphea     Family History  Problem Relation Age of Onset  .  Diabetes Mother   . Hypertension Mother     Past Surgical History:  Procedure Laterality Date  . ROTATOR CUFF REPAIR     Right   Social History   Occupational History  . Not on file.   Social History Main Topics  . Smoking status: Never Smoker  . Smokeless tobacco: Former NeurosurgeonUser  . Alcohol use Yes     Comment: occasionally  . Drug use: No  . Sexual activity: Yes    Birth control/ protection: None

## 2017-06-15 ENCOUNTER — Ambulatory Visit
Admission: RE | Admit: 2017-06-15 | Discharge: 2017-06-15 | Disposition: A | Discharge status: Home / Self Care | Payer: Managed Care, Other (non HMO) | Source: Ambulatory Visit | Attending: Orthopedic Surgery | Admitting: Orthopedic Surgery

## 2017-06-15 ENCOUNTER — Other Ambulatory Visit: Payer: Self-pay | Admitting: Orthopedic Surgery

## 2017-06-15 DIAGNOSIS — M4310 Spondylolisthesis, site unspecified: Secondary | ICD-10-CM

## 2019-04-07 IMAGING — DX DG SHOULDER 2+V*L*
3 series · 3 of 3 positions shown · non-contrast
Comparison: None.

CLINICAL DATA: Acute pain of left shoulder

EXAM:
LEFT SHOULDER - 2+ VIEW

[shoulder ap]
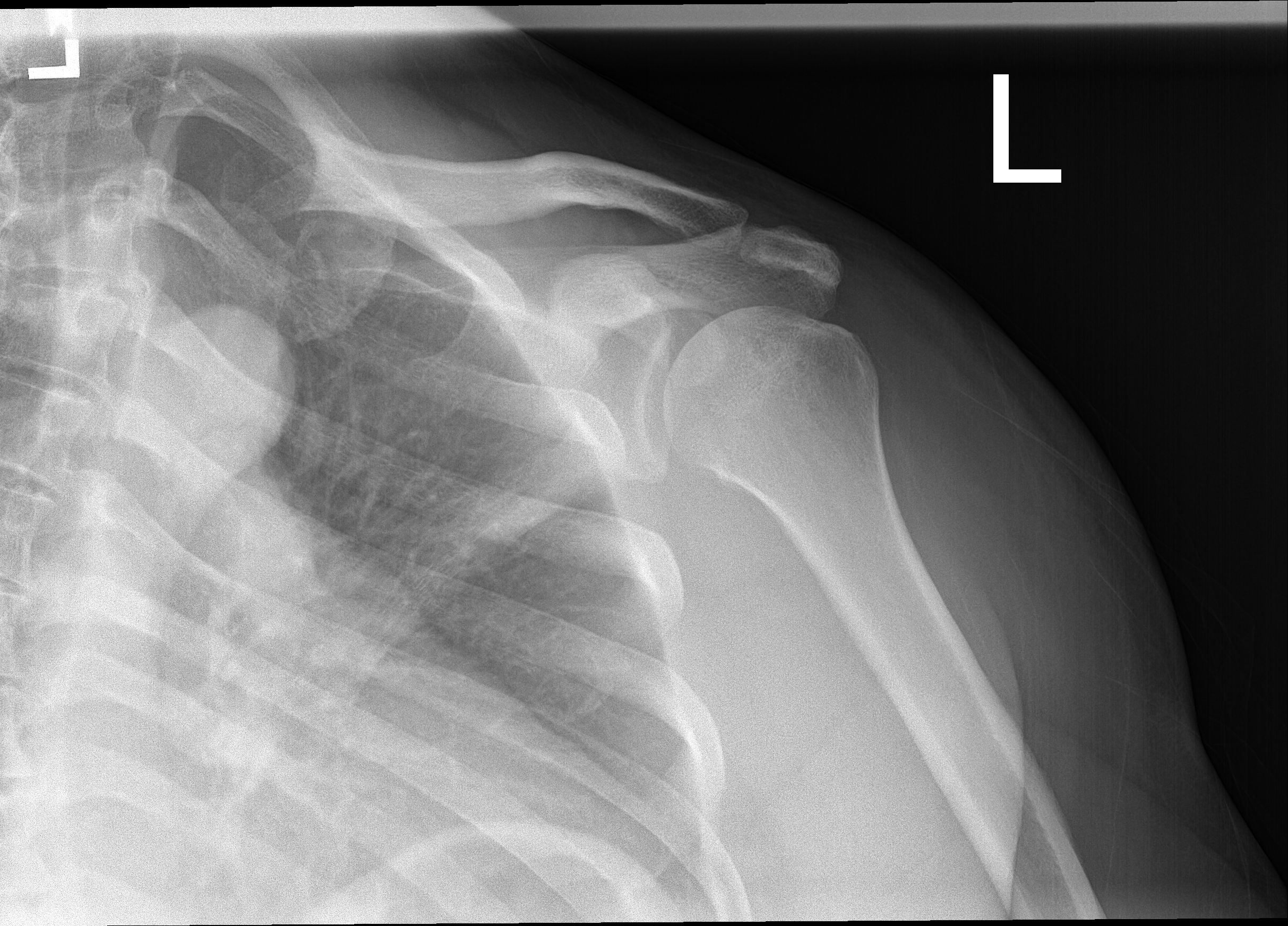

[shoulder y-view]
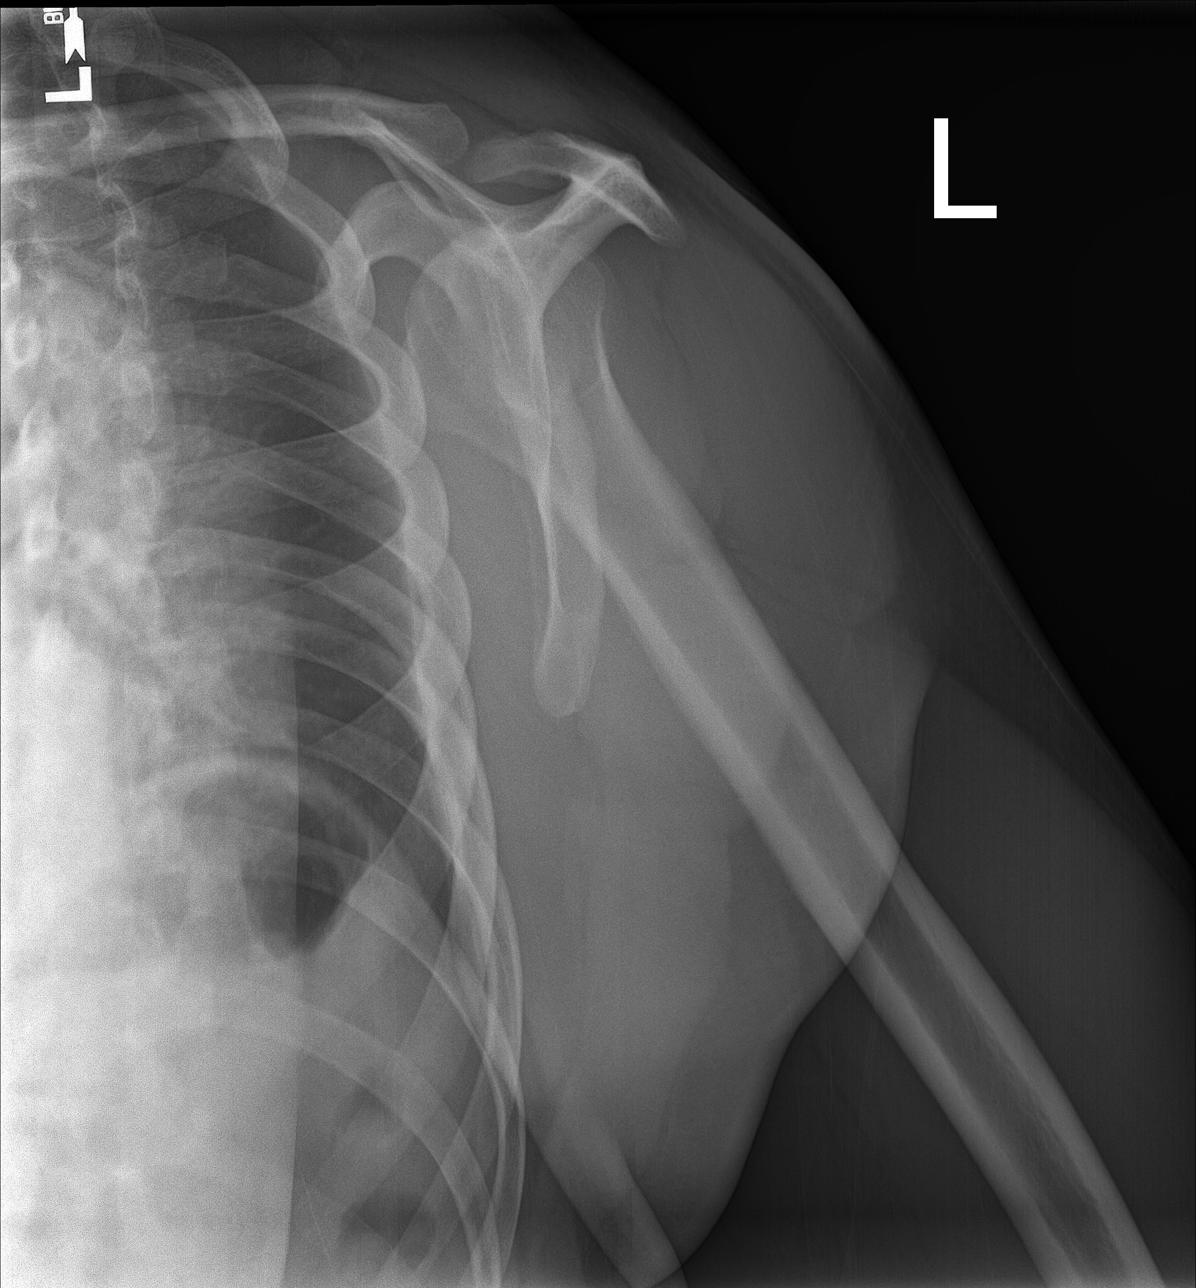

[shoulder axial]
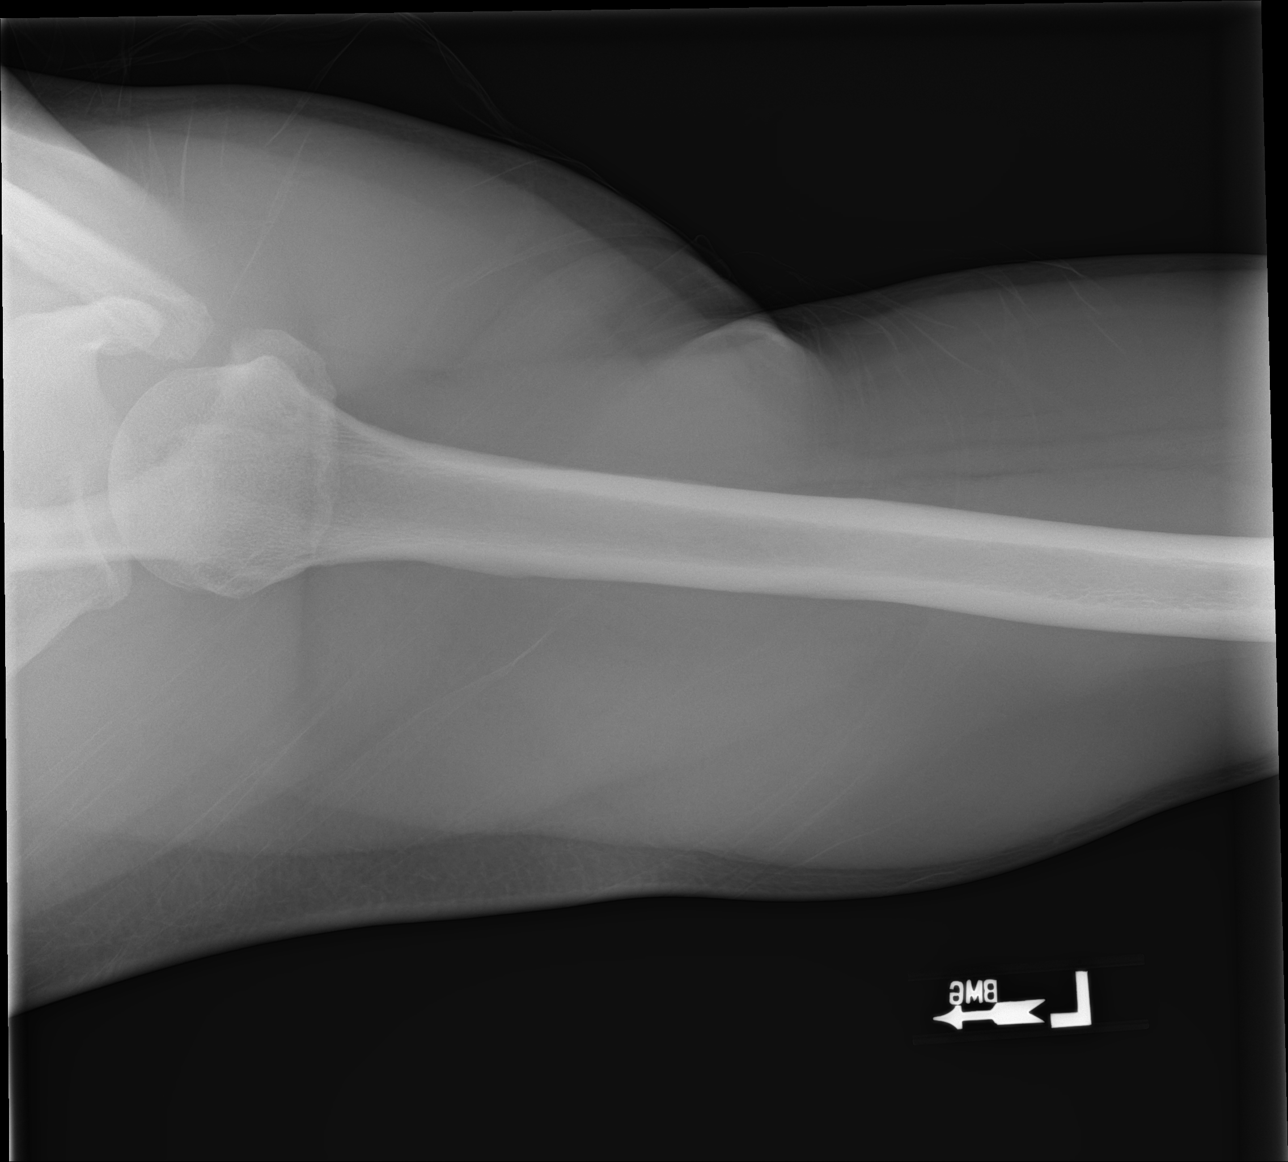

[3 of 3 positions shown; findings below may reference images not displayed]

FINDINGS: There is no evidence of fracture or dislocation. There is no
evidence of arthropathy or other focal bone abnormality. Soft
tissues are unremarkable.
IMPRESSION: Negative.
